# Patient Record
Sex: Female | Born: 1985 | Race: Black or African American | Hispanic: No | Smoking: Current every day smoker
Health system: Southern US, Community
[De-identification: ages and names within clinical notes are randomized; demographics above are authoritative.]

## PROBLEM LIST (undated history)

## (undated) ENCOUNTER — Emergency Department (HOSPITAL_COMMUNITY): Admission: EM | Payer: Medicaid Other | Source: Home / Self Care

## (undated) ENCOUNTER — Inpatient Hospital Stay (HOSPITAL_COMMUNITY): Payer: Self-pay

## (undated) DIAGNOSIS — IMO0002 Reserved for concepts with insufficient information to code with codable children: Secondary | ICD-10-CM

## (undated) DIAGNOSIS — K219 Gastro-esophageal reflux disease without esophagitis: Secondary | ICD-10-CM

## (undated) DIAGNOSIS — K2970 Gastritis, unspecified, without bleeding: Secondary | ICD-10-CM

## (undated) DIAGNOSIS — R011 Cardiac murmur, unspecified: Secondary | ICD-10-CM

## (undated) DIAGNOSIS — O039 Complete or unspecified spontaneous abortion without complication: Secondary | ICD-10-CM

## (undated) DIAGNOSIS — A630 Anogenital (venereal) warts: Secondary | ICD-10-CM

## (undated) DIAGNOSIS — D573 Sickle-cell trait: Secondary | ICD-10-CM

## (undated) HISTORY — PX: CRYOTHERAPY: SHX1416

## (undated) HISTORY — PX: COLONOSCOPY: SHX5424

## (undated) HISTORY — PX: INDUCED ABORTION: SHX677

## (undated) HISTORY — PX: COLPOSCOPY: SHX161

## (undated) HISTORY — PX: UPPER GASTROINTESTINAL ENDOSCOPY: SHX188

---

## 2007-01-21 ENCOUNTER — Emergency Department (HOSPITAL_COMMUNITY): Admission: EM | Admit: 2007-01-21 | Discharge: 2007-01-21 | Payer: Self-pay | Admitting: Emergency Medicine

## 2007-09-24 ENCOUNTER — Emergency Department (HOSPITAL_COMMUNITY): Admission: EM | Admit: 2007-09-24 | Discharge: 2007-09-24 | Payer: Self-pay | Admitting: Family Medicine

## 2007-09-25 ENCOUNTER — Inpatient Hospital Stay (HOSPITAL_COMMUNITY): Admission: AD | Admit: 2007-09-25 | Discharge: 2007-09-25 | Payer: Self-pay | Admitting: Obstetrics & Gynecology

## 2007-11-03 ENCOUNTER — Inpatient Hospital Stay (HOSPITAL_COMMUNITY): Admission: AD | Admit: 2007-11-03 | Discharge: 2007-11-04 | Payer: Self-pay | Admitting: Family Medicine

## 2007-11-15 ENCOUNTER — Inpatient Hospital Stay (HOSPITAL_COMMUNITY): Admission: AD | Admit: 2007-11-15 | Discharge: 2007-11-15 | Payer: Self-pay | Admitting: Obstetrics & Gynecology

## 2007-11-17 ENCOUNTER — Inpatient Hospital Stay (HOSPITAL_COMMUNITY): Admission: AD | Admit: 2007-11-17 | Discharge: 2007-11-17 | Payer: Self-pay | Admitting: Obstetrics and Gynecology

## 2008-01-12 ENCOUNTER — Inpatient Hospital Stay (HOSPITAL_COMMUNITY): Admission: AD | Admit: 2008-01-12 | Discharge: 2008-01-12 | Payer: Self-pay | Admitting: Obstetrics & Gynecology

## 2008-07-16 ENCOUNTER — Emergency Department (HOSPITAL_COMMUNITY): Admission: EM | Admit: 2008-07-16 | Discharge: 2008-07-16 | Payer: Self-pay | Admitting: Emergency Medicine

## 2008-08-01 ENCOUNTER — Emergency Department (HOSPITAL_COMMUNITY): Admission: EM | Admit: 2008-08-01 | Discharge: 2008-08-01 | Payer: Self-pay | Admitting: Emergency Medicine

## 2008-10-01 ENCOUNTER — Emergency Department (HOSPITAL_COMMUNITY): Admission: EM | Admit: 2008-10-01 | Discharge: 2008-10-01 | Payer: Self-pay | Admitting: Emergency Medicine

## 2009-06-17 ENCOUNTER — Inpatient Hospital Stay (HOSPITAL_COMMUNITY): Admission: AD | Admit: 2009-06-17 | Discharge: 2009-06-17 | Payer: Self-pay | Admitting: Obstetrics

## 2010-09-13 ENCOUNTER — Inpatient Hospital Stay (HOSPITAL_COMMUNITY)
Admission: AD | Admit: 2010-09-13 | Discharge: 2010-09-13 | Disposition: A | Discharge status: Home / Self Care | Payer: Medicaid Other | Source: Ambulatory Visit | Attending: Obstetrics & Gynecology | Admitting: Obstetrics & Gynecology

## 2010-09-13 DIAGNOSIS — N898 Other specified noninflammatory disorders of vagina: Secondary | ICD-10-CM | POA: Insufficient documentation

## 2010-09-14 ENCOUNTER — Inpatient Hospital Stay (HOSPITAL_COMMUNITY)
Admission: AD | Admit: 2010-09-14 | Discharge: 2010-09-14 | Disposition: A | Discharge status: Home / Self Care | Payer: Medicaid Other | Source: Ambulatory Visit | Attending: Obstetrics & Gynecology | Admitting: Obstetrics & Gynecology

## 2010-09-14 DIAGNOSIS — O2390 Unspecified genitourinary tract infection in pregnancy, unspecified trimester: Secondary | ICD-10-CM | POA: Insufficient documentation

## 2010-09-14 DIAGNOSIS — N760 Acute vaginitis: Secondary | ICD-10-CM

## 2010-09-14 LAB — URINALYSIS, ROUTINE W REFLEX MICROSCOPIC
Bilirubin Urine: NEGATIVE
Hgb urine dipstick: NEGATIVE
Ketones, ur: NEGATIVE mg/dL
Protein, ur: NEGATIVE mg/dL
Urine Glucose, Fasting: NEGATIVE mg/dL
Urobilinogen, UA: 0.2 mg/dL (ref 0.0–1.0)

## 2010-09-14 LAB — WET PREP, GENITAL: Yeast Wet Prep HPF POC: NONE SEEN

## 2010-09-15 LAB — GC/CHLAMYDIA PROBE AMP, GENITAL
Chlamydia, DNA Probe: NEGATIVE
GC Probe Amp, Genital: NEGATIVE

## 2010-10-26 LAB — POCT PREGNANCY, URINE: Preg Test, Ur: NEGATIVE

## 2010-11-03 LAB — RPR: RPR Ser Ql: NONREACTIVE

## 2010-11-03 LAB — URINALYSIS, ROUTINE W REFLEX MICROSCOPIC
Glucose, UA: NEGATIVE mg/dL
Ketones, ur: NEGATIVE mg/dL
Protein, ur: NEGATIVE mg/dL
Urobilinogen, UA: 1 mg/dL (ref 0.0–1.0)

## 2010-11-03 LAB — GC/CHLAMYDIA PROBE AMP, GENITAL: Chlamydia, DNA Probe: NEGATIVE

## 2010-11-03 LAB — WET PREP, GENITAL

## 2010-11-07 LAB — URINALYSIS, ROUTINE W REFLEX MICROSCOPIC
Bilirubin Urine: NEGATIVE
Hgb urine dipstick: NEGATIVE
Protein, ur: NEGATIVE mg/dL
Urobilinogen, UA: 0.2 mg/dL (ref 0.0–1.0)

## 2010-11-07 LAB — PREGNANCY, URINE: Preg Test, Ur: POSITIVE

## 2011-04-17 LAB — POCT URINALYSIS DIP (DEVICE)
Bilirubin Urine: NEGATIVE
Hgb urine dipstick: NEGATIVE
Ketones, ur: NEGATIVE
Nitrite: NEGATIVE
Protein, ur: NEGATIVE
pH: 6.5

## 2011-04-17 LAB — GC/CHLAMYDIA PROBE AMP, GENITAL
Chlamydia, DNA Probe: NEGATIVE
GC Probe Amp, Genital: NEGATIVE

## 2011-04-17 LAB — CBC
HCT: 34 — ABNORMAL LOW
Hemoglobin: 11.7 — ABNORMAL LOW
MCHC: 34.3
MCV: 86.7
Platelets: 200
RBC: 3.92
RDW: 13.2
WBC: 7.7

## 2011-04-17 LAB — POCT PREGNANCY, URINE
Operator id: 27537
Preg Test, Ur: POSITIVE

## 2011-04-17 LAB — URINALYSIS, ROUTINE W REFLEX MICROSCOPIC
Glucose, UA: NEGATIVE
Ketones, ur: NEGATIVE
Protein, ur: 30 — AB

## 2011-04-17 LAB — URINE MICROSCOPIC-ADD ON

## 2011-04-17 LAB — URINE CULTURE: Colony Count: >=100000

## 2011-04-17 LAB — WET PREP, GENITAL
Trich, Wet Prep: NONE SEEN
Yeast Wet Prep HPF POC: NONE SEEN

## 2011-04-18 ENCOUNTER — Emergency Department (HOSPITAL_COMMUNITY)
Admission: EM | Admit: 2011-04-18 | Discharge: 2011-04-18 | Disposition: A | Discharge status: Home / Self Care | Payer: Self-pay | Attending: Emergency Medicine | Admitting: Emergency Medicine

## 2011-04-18 DIAGNOSIS — Z87440 Personal history of urinary (tract) infections: Secondary | ICD-10-CM | POA: Insufficient documentation

## 2011-04-18 DIAGNOSIS — R42 Dizziness and giddiness: Secondary | ICD-10-CM | POA: Insufficient documentation

## 2011-04-18 DIAGNOSIS — N342 Other urethritis: Secondary | ICD-10-CM | POA: Insufficient documentation

## 2011-04-18 DIAGNOSIS — A499 Bacterial infection, unspecified: Secondary | ICD-10-CM | POA: Insufficient documentation

## 2011-04-18 DIAGNOSIS — N760 Acute vaginitis: Secondary | ICD-10-CM | POA: Insufficient documentation

## 2011-04-18 DIAGNOSIS — R110 Nausea: Secondary | ICD-10-CM | POA: Insufficient documentation

## 2011-04-18 DIAGNOSIS — N938 Other specified abnormal uterine and vaginal bleeding: Secondary | ICD-10-CM | POA: Insufficient documentation

## 2011-04-18 DIAGNOSIS — N949 Unspecified condition associated with female genital organs and menstrual cycle: Secondary | ICD-10-CM | POA: Insufficient documentation

## 2011-04-18 DIAGNOSIS — D573 Sickle-cell trait: Secondary | ICD-10-CM | POA: Insufficient documentation

## 2011-04-18 DIAGNOSIS — B9689 Other specified bacterial agents as the cause of diseases classified elsewhere: Secondary | ICD-10-CM | POA: Insufficient documentation

## 2011-04-18 LAB — URINALYSIS, ROUTINE W REFLEX MICROSCOPIC
Glucose, UA: NEGATIVE mg/dL
Leukocytes, UA: NEGATIVE
Protein, ur: NEGATIVE mg/dL
Specific Gravity, Urine: 1.012 (ref 1.005–1.030)
pH: 6 (ref 5.0–8.0)

## 2011-04-18 LAB — CBC
Hemoglobin: 10.6 — ABNORMAL LOW
MCHC: 35.6
MCV: 88.1
RBC: 3.4 — ABNORMAL LOW
RDW: 14.2

## 2011-04-18 LAB — GC/CHLAMYDIA PROBE AMP, GENITAL
Chlamydia, DNA Probe: NEGATIVE
GC Probe Amp, Genital: NEGATIVE

## 2011-04-18 LAB — WET PREP, GENITAL

## 2011-04-18 LAB — POCT I-STAT, CHEM 8
Calcium, Ion: 1.22 mmol/L (ref 1.12–1.32)
Creatinine, Ser: 0.9 mg/dL (ref 0.50–1.10)
Glucose, Bld: 91 mg/dL (ref 70–99)
HCT: 35 % — ABNORMAL LOW (ref 36.0–46.0)
Hemoglobin: 11.9 g/dL — ABNORMAL LOW (ref 12.0–15.0)
TCO2: 25 mmol/L (ref 0–100)

## 2011-04-18 LAB — URINE MICROSCOPIC-ADD ON

## 2011-04-20 LAB — URINALYSIS, ROUTINE W REFLEX MICROSCOPIC
Bilirubin Urine: NEGATIVE
Glucose, UA: NEGATIVE
Protein, ur: NEGATIVE

## 2011-04-20 LAB — POCT PREGNANCY, URINE
Operator id: 223331
Preg Test, Ur: NEGATIVE

## 2011-04-20 LAB — WET PREP, GENITAL: Yeast Wet Prep HPF POC: NONE SEEN

## 2011-04-20 LAB — URINE MICROSCOPIC-ADD ON: WBC, UA: NONE SEEN

## 2011-04-20 LAB — GC/CHLAMYDIA PROBE AMP, GENITAL: GC Probe Amp, Genital: NEGATIVE

## 2011-04-28 LAB — CBC
HCT: 34.2 % — ABNORMAL LOW (ref 36.0–46.0)
Hemoglobin: 11.6 g/dL — ABNORMAL LOW (ref 12.0–15.0)
RBC: 3.95 MIL/uL (ref 3.87–5.11)
WBC: 3.8 10*3/uL — ABNORMAL LOW (ref 4.0–10.5)

## 2011-04-28 LAB — PREGNANCY, URINE: Preg Test, Ur: POSITIVE

## 2011-04-28 LAB — URINALYSIS, ROUTINE W REFLEX MICROSCOPIC
Nitrite: NEGATIVE
Specific Gravity, Urine: 1.011 (ref 1.005–1.030)
pH: 6 (ref 5.0–8.0)

## 2011-04-28 LAB — WET PREP, GENITAL
Trich, Wet Prep: NONE SEEN
Yeast Wet Prep HPF POC: NONE SEEN

## 2011-04-28 LAB — DIFFERENTIAL
Eosinophils Relative: 1 % (ref 0–5)
Lymphocytes Relative: 25 % (ref 12–46)
Lymphs Abs: 0.9 10*3/uL (ref 0.7–4.0)
Monocytes Absolute: 0.2 10*3/uL (ref 0.1–1.0)

## 2011-04-28 LAB — BASIC METABOLIC PANEL
GFR calc non Af Amer: >60 mL/min (ref >60)
Potassium: 3.6 mEq/L (ref 3.5–5.1)
Sodium: 137 mEq/L (ref 135–145)

## 2011-04-28 LAB — HCG, QUANTITATIVE, PREGNANCY: hCG, Beta Chain, Quant, S: 6344 m[IU]/mL — ABNORMAL HIGH (ref <5)

## 2011-06-08 ENCOUNTER — Encounter: Payer: Self-pay | Admitting: *Deleted

## 2011-06-08 ENCOUNTER — Emergency Department (HOSPITAL_COMMUNITY)
Admission: EM | Admit: 2011-06-08 | Discharge: 2011-06-08 | Disposition: A | Discharge status: Home / Self Care | Payer: Medicaid Other | Attending: Emergency Medicine | Admitting: Emergency Medicine

## 2011-06-08 DIAGNOSIS — N898 Other specified noninflammatory disorders of vagina: Secondary | ICD-10-CM | POA: Insufficient documentation

## 2011-06-08 DIAGNOSIS — N9089 Other specified noninflammatory disorders of vulva and perineum: Secondary | ICD-10-CM

## 2011-06-08 DIAGNOSIS — T7840XA Allergy, unspecified, initial encounter: Secondary | ICD-10-CM

## 2011-06-08 DIAGNOSIS — X58XXXA Exposure to other specified factors, initial encounter: Secondary | ICD-10-CM | POA: Insufficient documentation

## 2011-06-08 LAB — URINALYSIS, ROUTINE W REFLEX MICROSCOPIC
Glucose, UA: NEGATIVE mg/dL
Hgb urine dipstick: NEGATIVE
Leukocytes, UA: NEGATIVE
Protein, ur: NEGATIVE mg/dL
Specific Gravity, Urine: 1.011 (ref 1.005–1.030)
pH: 5.5 (ref 5.0–8.0)

## 2011-06-08 LAB — WET PREP, GENITAL
Clue Cells Wet Prep HPF POC: NONE SEEN
Trich, Wet Prep: NONE SEEN
WBC, Wet Prep HPF POC: NONE SEEN
Yeast Wet Prep HPF POC: NONE SEEN

## 2011-06-08 NOTE — ED Provider Notes (Signed)
History     CSN: [id number] Arrival date & time: 06/08/2011 10:40 AM     Chief Complaint  Patient presents with  . Vaginal Pain    HPI Pt was seen at 1130.  Per pt, c/o gradual onset and persistence of constant labial "swelling" that began today after using "colored" condoms.  Pt states this has happened previously with the same colored condoms.  Pt also c/o gradual onset and persistence of constant vaginal discharge for the past several days.  Denies vaginal bleeding, no fevers, no dysuria, no back pain, no abd pain, no rash.    History reviewed. No pertinent past medical history.  History reviewed. No pertinent past surgical history.   History  Substance Use Topics  . Smoking status: Never Smoker   . Smokeless tobacco: Not on file  . Alcohol Use: No    Review of Systems ROS: Statement: All systems negative except as marked or noted in the HPI; Constitutional: Negative for fever and chills. ; ; Eyes: Negative for eye pain, redness and discharge. ; ; ENMT: Negative for ear pain, hoarseness, nasal congestion, sinus pressure and sore throat. ; ; Cardiovascular: Negative for chest pain, palpitations, diaphoresis, dyspnea and peripheral edema. ; ; Respiratory: Negative for cough, wheezing and stridor. ; ; Gastrointestinal: Negative for nausea, vomiting, diarrhea and abdominal pain, blood in stool, hematemesis, jaundice and rectal bleeding. . ; ; Genitourinary: Negative for dysuria, flank pain and hematuria. GYN:  No vaginal bleeding, +vaginal discharge, +vulvar edema.; Musculoskeletal: Negative for back pain and neck pain. Negative for swelling and trauma.; ; Skin: Negative for pruritus, rash, abrasions, blisters, bruising and skin lesion.; ; Neuro: Negative for headache, lightheadedness and neck stiffness. Negative for weakness, altered level of consciousness , altered mental status, extremity weakness, paresthesias, involuntary movement, seizure and syncope.     Allergies   Penicillins  Home Medications   Current Outpatient Rx  Name Route Sig Dispense Refill  . DOXYCYCLINE HYCLATE 100 MG PO CAPS Oral Take 100 mg by mouth 2 (two) times daily.      Marland Kitchen FERROUS SULFATE 325 (65 FE) MG PO TABS Oral Take 325 mg by mouth every other day.        BP 120/67  Pulse 86  Temp(Src) 99 F (37.2 C) (Oral)  Resp 16  SpO2 99%  LMP 05/15/2011  Physical Exam 1205: Physical examination:  Nursing notes reviewed; Vital signs and O2 SAT reviewed;  Constitutional: Well developed, Well nourished, Well hydrated, In no acute distress; Head:  Normocephalic, atraumatic; Eyes: EOMI, PERRL, No scleral icterus; ENMT: Mouth and pharynx normal, Mucous membranes moist; Neck: Supple, Full range of motion, No lymphadenopathy; Cardiovascular: Regular rate and rhythm, No murmur, rub, or gallop; Respiratory: Breath sounds clear & equal bilaterally, No rales, rhonchi, wheezes, or rub, Normal respiratory effort/excursion; Chest: Nontender, Movement normal; Abdomen: Soft, Nontender, Nondistended, Normal bowel sounds; Genitourinary: No CVA tenderness, Pelvic exam performed with permission of pt and female ED tech assist during exam.  External genitalia edematous but w/o erythema, ecchymosis or lesions. Vaginal vault with thin yellow-white discharge.  Cervix w/o lesions, not friable, GC/chlam and wet prep obtained and sent to lab.  Bimanual exam w/o CMT, uterine or adenexal tenderness.; Extremities: Pulses normal, No tenderness, No edema, No calf edema or asymmetry.; Neuro: AA&Ox3, Major CN grossly intact.  No gross focal motor or sensory deficits in extremities.; Skin: Color normal, Warm, Dry, no rash.    ED Course  Procedures   MDM  MDM Reviewed: nursing note and  vitals Interpretation: labs    Results for orders placed during the hospital encounter of 06/08/11  URINALYSIS, ROUTINE W REFLEX MICROSCOPIC      Component Value Range   Color, Urine YELLOW  YELLOW    Appearance CLEAR  CLEAR     Specific Gravity, Urine 1.011  1.005 - 1.030    pH 5.5  5.0 - 8.0    Glucose, UA NEGATIVE  NEGATIVE (mg/dL)   Hgb urine dipstick NEGATIVE  NEGATIVE    Bilirubin Urine NEGATIVE  NEGATIVE    Ketones, ur NEGATIVE  NEGATIVE (mg/dL)   Protein, ur NEGATIVE  NEGATIVE (mg/dL)   Urobilinogen, UA 0.2  0.0 - 1.0 (mg/dL)   Nitrite NEGATIVE  NEGATIVE    Leukocytes, UA NEGATIVE  NEGATIVE   WET PREP, GENITAL      Component Value Range   Yeast, Wet Prep NONE SEEN  NONE SEEN    Trich, Wet Prep NONE SEEN  NONE SEEN    Clue Cells, Wet Prep NONE SEEN  NONE SEEN    WBC, Wet Prep HPF POC NONE SEEN  NONE SEEN   PREGNANCY, URINE      Component Value Range   Preg Test, Ur NEGATIVE      12:57 PM:  States she wants to leave right now.  Likely localized allergic rxn to colored condoms, as this has happened previously under the same circumstances; pt encouraged not to use them again.  Dx testing d/w pt.  Questions answered.  Verb understanding, agreeable to d/c home with outpt f/u.    Hiliary Osorto Allison Quarry, DO 06/09/11 2204

## 2011-06-08 NOTE — ED Notes (Addendum)
Pt had intercourse this AM, noticed vaginal swelling immediately after the intercourse. Been having vaginal discharge x 3week, large amount, yellow color with odor. sts her perineal feels tight and itchy  Pt sts she only has one partner. Upon brief assessment, pt's perineal appeared red and swollen. Partner uses latex condom, happened once before, and both occurrence are with colored latex condom.

## 2011-06-08 NOTE — ED Notes (Signed)
Pt in c/o vaginal pain and swelling after intercourse today, pt states the swelling is abnormal, also pt noted increased discharge and odor over last few days

## 2011-06-09 LAB — GC/CHLAMYDIA PROBE AMP, GENITAL: GC Probe Amp, Genital: NEGATIVE

## 2011-07-03 ENCOUNTER — Emergency Department (HOSPITAL_COMMUNITY)
Admission: EM | Admit: 2011-07-03 | Discharge: 2011-07-03 | Disposition: A | Discharge status: Home / Self Care | Payer: Medicaid Other | Attending: Emergency Medicine | Admitting: Emergency Medicine

## 2011-07-03 ENCOUNTER — Encounter (HOSPITAL_COMMUNITY): Payer: Self-pay | Admitting: *Deleted

## 2011-07-03 DIAGNOSIS — R10816 Epigastric abdominal tenderness: Secondary | ICD-10-CM | POA: Insufficient documentation

## 2011-07-03 DIAGNOSIS — A5903 Trichomonal cystitis and urethritis: Secondary | ICD-10-CM | POA: Insufficient documentation

## 2011-07-03 DIAGNOSIS — N390 Urinary tract infection, site not specified: Secondary | ICD-10-CM

## 2011-07-03 LAB — URINALYSIS, ROUTINE W REFLEX MICROSCOPIC
Glucose, UA: NEGATIVE mg/dL
Hgb urine dipstick: NEGATIVE
Ketones, ur: NEGATIVE mg/dL
Protein, ur: NEGATIVE mg/dL
pH: 6.5 (ref 5.0–8.0)

## 2011-07-03 LAB — URINE MICROSCOPIC-ADD ON

## 2011-07-03 MED ORDER — PHENAZOPYRIDINE HCL 200 MG PO TABS: 200.0000 mg | ORAL_TABLET | Freq: Three times a day (TID) | ORAL | Status: AC

## 2011-07-03 MED ORDER — NITROFURANTOIN MONOHYD MACRO 100 MG PO CAPS: 100.0000 mg | ORAL_CAPSULE | Freq: Two times a day (BID) | ORAL | Status: AC

## 2011-07-03 MED ORDER — METRONIDAZOLE 500 MG PO TABS
2000.0000 mg | ORAL_TABLET | Freq: Once | ORAL | Status: AC
  Administered 2011-07-03: 2000 mg via ORAL

## 2011-07-03 NOTE — ED Provider Notes (Signed)
Medical screening examination/treatment/procedure(s) were performed by non-physician practitioner and as supervising physician I was immediately available for consultation/collaboration.  Flint Melter, MD 07/03/11 [phone]

## 2011-07-03 NOTE — ED Provider Notes (Signed)
History     CSN: [id number] Arrival date & time: 07/03/2011  7:04 PM   First MD Initiated Contact with Patient 07/03/11 2054      Chief Complaint  Patient presents with  . Urinary Tract Infection    pt c/o urinary urgency, and reports urine has an odor.     (Consider location/radiation/quality/duration/timing/severity/associated sxs/prior treatment) HPI History provided by pt.   Pt has had tingling w/ urination and cloudy-colored urine for the past 2+ wks.  No urinary frequency or hematuria.  No associated fever or N/V.  Has pain across low back as well as abd pain.  No vaginal sx.  H/o UTI and presented similarly.  No relief w/ azo standard.  History reviewed. No pertinent past medical history.  History reviewed. No pertinent past surgical history.  History reviewed. No pertinent family history.  History  Substance Use Topics  . Smoking status: Never Smoker   . Smokeless tobacco: Not on file  . Alcohol Use: Yes     occ    OB History    Grav Para Term Preterm Abortions TAB SAB Ect Mult Living                  Review of Systems  All other systems reviewed and are negative.    Allergies  Penicillins  Home Medications   Current Outpatient Rx  Name Route Sig Dispense Refill  . FERROUS SULFATE 325 (65 FE) MG PO TABS Oral Take 325 mg by mouth every other day.      Marland Kitchen PRENATAL 27-0.8 MG PO TABS Oral Take 1 tablet by mouth daily.        BP 109/74  Pulse 79  Temp(Src) 98.9 F (37.2 C) (Oral)  Resp 20  Wt 108 lb (48.988 kg)  SpO2 100%  LMP 05/16/2011  Physical Exam  Nursing note and vitals reviewed. Constitutional: She is oriented to person, place, and time. She appears well-developed and well-nourished. No distress.  HENT:  Head: Normocephalic and atraumatic.  Eyes:       Normal appearance  Neck: Normal range of motion.  Cardiovascular: Normal rate and regular rhythm.   Pulmonary/Chest: Effort normal and breath sounds normal.  Abdominal: Soft. Bowel  sounds are normal. She exhibits no distension and no mass. There is no rebound and no guarding.       Mild tenderness epigastrium only.  No CVA tenderness  Neurological: She is alert and oriented to person, place, and time.  Skin: Skin is warm and dry. No rash noted.  Psychiatric: She has a normal mood and affect. Her behavior is normal.    ED Course  Procedures (including critical care time)  Labs Reviewed  URINALYSIS, ROUTINE W REFLEX MICROSCOPIC - Abnormal; Notable for the following:    Leukocytes, UA MODERATE (*)    All other components within normal limits  URINE MICROSCOPIC-ADD ON   No results found.   1. UTI (lower urinary tract infection)   2. Trichomonal urethritis       MDM  Pt presents w/ c/o tingling w/ urination.  U/A pos for WBCs, mod leuk est and trich.  Pyelo unlikely because afebrile and no CVA tenderness or N/V.  Will treat w/ macrobid, single dose of flagyl in ED and pyridium.  Return precautions discussed.         Arie Sabina Blackwell, [place] 07/03/11 [phone]

## 2011-10-03 ENCOUNTER — Emergency Department (HOSPITAL_COMMUNITY)
Admission: EM | Admit: 2011-10-03 | Discharge: 2011-10-04 | Disposition: A | Discharge status: Home / Self Care | Payer: Medicaid Other | Attending: Emergency Medicine | Admitting: Emergency Medicine

## 2011-10-03 ENCOUNTER — Encounter (HOSPITAL_COMMUNITY): Payer: Self-pay | Admitting: *Deleted

## 2011-10-03 DIAGNOSIS — K625 Hemorrhage of anus and rectum: Secondary | ICD-10-CM | POA: Insufficient documentation

## 2011-10-03 DIAGNOSIS — R109 Unspecified abdominal pain: Secondary | ICD-10-CM | POA: Insufficient documentation

## 2011-10-03 DIAGNOSIS — K649 Unspecified hemorrhoids: Secondary | ICD-10-CM

## 2011-10-03 DIAGNOSIS — K644 Residual hemorrhoidal skin tags: Secondary | ICD-10-CM | POA: Insufficient documentation

## 2011-10-03 LAB — DIFFERENTIAL
Basophils Absolute: 0 10*3/uL (ref 0.0–0.1)
Basophils Relative: 1 % (ref 0–1)
Lymphocytes Relative: 50 % — ABNORMAL HIGH (ref 12–46)
Monocytes Relative: 5 % (ref 3–12)
Neutro Abs: 1.6 10*3/uL — ABNORMAL LOW (ref 1.7–7.7)
Neutrophils Relative %: 40 % — ABNORMAL LOW (ref 43–77)

## 2011-10-03 LAB — POCT I-STAT, CHEM 8
Chloride: 103 mEq/L (ref 96–112)
HCT: 37 % (ref 36.0–46.0)
Hemoglobin: 12.6 g/dL (ref 12.0–15.0)
Potassium: 3.9 mEq/L (ref 3.5–5.1)
Sodium: 139 mEq/L (ref 135–145)

## 2011-10-03 LAB — CBC
HCT: 34.3 % — ABNORMAL LOW (ref 36.0–46.0)
Hemoglobin: 12.4 g/dL (ref 12.0–15.0)
MCHC: 36.2 g/dL — ABNORMAL HIGH (ref 30.0–36.0)
RDW: 12.8 % (ref 11.5–15.5)
WBC: 4 10*3/uL (ref 4.0–10.5)

## 2011-10-03 NOTE — ED Notes (Signed)
Received pt. From triage, pt. Ambulatory gait steady, NAD noted

## 2011-10-03 NOTE — ED Provider Notes (Addendum)
History     CSN: [id number]  Arrival date & time 10/03/11  2016   First MD Initiated Contact with Patient 10/03/11 2303      Chief Complaint  Patient presents with  . Rectal Bleeding    bright red x 1 today.    (Consider location/radiation/quality/duration/timing/severity/associated sxs/prior treatment) Patient is a 26 y.o. female presenting with hematochezia. The history is provided by the patient. No language interpreter was used.  Rectal Bleeding  The current episode started today. The onset was sudden. The problem occurs rarely. The problem has been resolved. The patient is experiencing no pain. The stool is described as hard. There was no prior unsuccessful therapy. Pertinent negatives include no anorexia, no fever, no abdominal pain, no diarrhea, no hematemesis, no hemorrhoids, no nausea, no rectal pain, no vomiting, no hematuria, no vaginal bleeding, no vaginal discharge, no chest pain, no headaches, no coughing, no difficulty breathing and no rash. She has been behaving normally. She has been eating and drinking normally. Urine output has been normal. The last void occurred less than 6 hours ago. Her past medical history does not include inflammatory bowel disease, recent abdominal injury, recent antibiotic use, recent change in diet or a recent illness. There were no sick contacts. She has received no recent medical care.    History reviewed. No pertinent past medical history.  History reviewed. No pertinent past surgical history.  History reviewed. No pertinent family history.  History  Substance Use Topics  . Smoking status: Never Smoker   . Smokeless tobacco: Not on file  . Alcohol Use: Yes     occ    OB History    Grav Para Term Preterm Abortions TAB SAB Ect Mult Living                  Review of Systems  Constitutional: Negative for fever.  HENT: Negative.   Eyes: Negative.   Respiratory: Negative for cough.   Cardiovascular: Negative for chest pain.    Gastrointestinal: Positive for hematochezia. Negative for nausea, vomiting, abdominal pain, diarrhea, rectal pain, anorexia, hematemesis and hemorrhoids.  Genitourinary: Negative for hematuria, vaginal bleeding and vaginal discharge.  Musculoskeletal: Negative.   Skin: Negative for rash.  Neurological: Negative for headaches.  Hematological: Negative.   Psychiatric/Behavioral: Negative.     Allergies  Penicillins  Home Medications   Current Outpatient Rx  Name Route Sig Dispense Refill  . FERROUS SULFATE 325 (65 FE) MG PO TABS Oral Take 325 mg by mouth every other day.      Marland Kitchen PRENATAL 27-0.8 MG PO TABS Oral Take 1 tablet by mouth daily.        BP 111/63  Pulse 104  Temp(Src) 98 F (36.7 C) (Oral)  Resp 20  SpO2 97%  Physical Exam  Constitutional: She is oriented to person, place, and time. She appears well-developed.  HENT:  Head: Normocephalic and atraumatic.  Eyes: Conjunctivae are normal. Pupils are equal, round, and reactive to light.  Neck: Normal range of motion. Neck supple. No tracheal deviation present.  Cardiovascular: Normal rate and regular rhythm.   Pulmonary/Chest: Effort normal and breath sounds normal.  Abdominal: Soft. Bowel sounds are normal. There is no tenderness. There is no rebound and no guarding.  Genitourinary: Guaiac positive stool.       Hemorrhoid external non thrombosed  Musculoskeletal: Normal range of motion. She exhibits no edema.  Neurological: She is alert and oriented to person, place, and time.  Skin: Skin is warm and dry.  Psychiatric: She has a normal mood and affect.    ED Course  Procedures (including critical care time)   Labs Reviewed  CBC  DIFFERENTIAL   No results found.   No diagnosis found.    MDM  Follow up with your family doctor and GI return immediately for worsening bleeding patient verbalizes understanding and agrees to follow up      Andreia Gandolfi K Fayez Sturgell-Rasch, MD 10/04/11 0025  Remigio Mcmillon Smitty Cords, MD 10/04/11 0345

## 2011-10-03 NOTE — ED Notes (Signed)
Pt in c/o episode of rectal bleeding today, states blood was bright red, denies pain

## 2011-10-04 ENCOUNTER — Emergency Department (HOSPITAL_COMMUNITY): Payer: Medicaid Other

## 2011-10-04 LAB — POCT PREGNANCY, URINE: Preg Test, Ur: NEGATIVE

## 2011-10-04 MED ORDER — IOHEXOL 300 MG/ML  SOLN
100.0000 mL | Freq: Once | INTRAMUSCULAR | Status: AC | PRN
  Administered 2011-10-04: 100 mL via INTRAVENOUS

## 2011-10-04 NOTE — Discharge Instructions (Signed)

## 2011-10-04 NOTE — ED Notes (Signed)
Pt. Discharged to home, pt. Ambulatory gait steady,

## 2011-11-03 ENCOUNTER — Encounter (HOSPITAL_COMMUNITY): Payer: Self-pay | Admitting: *Deleted

## 2011-11-03 ENCOUNTER — Inpatient Hospital Stay (HOSPITAL_COMMUNITY)
Admission: AD | Admit: 2011-11-03 | Discharge: 2011-11-03 | Disposition: A | Discharge status: Home / Self Care | Payer: Medicaid Other | Source: Ambulatory Visit | Attending: Obstetrics | Admitting: Obstetrics

## 2011-11-03 DIAGNOSIS — N949 Unspecified condition associated with female genital organs and menstrual cycle: Secondary | ICD-10-CM | POA: Insufficient documentation

## 2011-11-03 DIAGNOSIS — N926 Irregular menstruation, unspecified: Secondary | ICD-10-CM

## 2011-11-03 DIAGNOSIS — N938 Other specified abnormal uterine and vaginal bleeding: Secondary | ICD-10-CM | POA: Insufficient documentation

## 2011-11-03 HISTORY — DX: Reserved for concepts with insufficient information to code with codable children: IMO0002

## 2011-11-03 HISTORY — DX: Anogenital (venereal) warts: A63.0

## 2011-11-03 HISTORY — DX: Sickle-cell trait: D57.3

## 2011-11-03 HISTORY — DX: Gastro-esophageal reflux disease without esophagitis: K21.9

## 2011-11-03 HISTORY — DX: Cardiac murmur, unspecified: R01.1

## 2011-11-03 HISTORY — DX: Gastritis, unspecified, without bleeding: K29.70

## 2011-11-03 LAB — URINE MICROSCOPIC-ADD ON

## 2011-11-03 LAB — URINALYSIS, ROUTINE W REFLEX MICROSCOPIC
Ketones, ur: NEGATIVE mg/dL
Leukocytes, UA: NEGATIVE
Nitrite: NEGATIVE
Protein, ur: NEGATIVE mg/dL
Urobilinogen, UA: 0.2 mg/dL (ref 0.0–1.0)

## 2011-11-03 NOTE — MAU Provider Note (Signed)
History   Pt presents today c/o irregular vag bleeding. She states her period normally begins in about a week from now. She began having spotting on Monday with no pain. Yesterday, she underwent an endoscopy and colonoscopy. Since that time she has had some continued abd cramping. She just wants to make sure everything is ok. She denies fever, dysuria, or any other sx at this time.  CSN: [id number]  Arrival date and time: 11/03/11 2202   None     Chief Complaint  Patient presents with  . Vaginal Bleeding   HPI  OB History    Grav Para Term Preterm Abortions TAB SAB Ect Mult Living   6 4 4  2 2    4       Past Medical History  Diagnosis Date  . Abnormal Pap smear   . Sickle cell trait   . Heart murmur   . HPV (human papilloma virus) anogenital infection   . GERD (gastroesophageal reflux disease)   . Gastritis   . Hemorrhoids     Past Surgical History  Procedure Date  . Colposcopy   . Induced abortion   . Cryotherapy   . Colonoscopy   . Upper gastrointestinal endoscopy     Family History  Problem Relation Age of Onset  . Diabetes Father   . Diabetes Paternal Grandmother     History  Substance Use Topics  . Smoking status: Never Smoker   . Smokeless tobacco: Not on file  . Alcohol Use: Yes     occ    Allergies:  Allergies  Allergen Reactions  . Penicillins Hives    No prescriptions prior to admission    Review of Systems  Constitutional: Negative for fever and chills.  Eyes: Negative for blurred vision and double vision.  Cardiovascular: Negative for chest pain and palpitations.  Gastrointestinal: Positive for abdominal pain. Negative for nausea, vomiting, diarrhea and constipation.  Genitourinary: Negative for dysuria, urgency, frequency and hematuria.  Neurological: Negative for dizziness and headaches.  Psychiatric/Behavioral: Negative for depression and suicidal ideas.   Physical Exam   Blood pressure 116/65, pulse 67, temperature 98.5 F  (36.9 C), resp. rate 18, height 5' 3.5" (1.613 m), weight 110 lb 4 oz (50.009 kg), last menstrual period 10/09/2011.  Physical Exam  Nursing note and vitals reviewed. Constitutional: She is oriented to person, place, and time. She appears well-developed and well-nourished. No distress.  HENT:  Head: Normocephalic and atraumatic.  Eyes: EOM are normal. Pupils are equal, round, and reactive to light.  GI: Soft. She exhibits no distension and no mass. There is no tenderness. There is no rebound and no guarding.  Genitourinary: No bleeding around the vagina. Vaginal discharge found.       Slight vag dc present. Cervix Lg/closed. No active bleeding seen. Uterus anteverted and NL size and shape. No adnexal masses.  Neurological: She is alert and oriented to person, place, and time.  Skin: Skin is warm and dry. She is not diaphoretic.  Psychiatric: She has a normal mood and affect. Her behavior is normal. Judgment and thought content normal.    MAU Course  Procedures  Wet prep and GC/Chlamydia cultures done.  Results for orders placed during the hospital encounter of 11/03/11 (from the past 24 hour(s))  URINALYSIS, ROUTINE W REFLEX MICROSCOPIC     Status: Abnormal   Collection Time   11/03/11 10:14 PM      Component Value Range   Color, Urine YELLOW  YELLOW  APPearance CLEAR  CLEAR    Specific Gravity, Urine 1.020  1.005 - 1.030    pH 6.0  5.0 - 8.0    Glucose, UA NEGATIVE  NEGATIVE (mg/dL)   Hgb urine dipstick LARGE (*) NEGATIVE    Bilirubin Urine NEGATIVE  NEGATIVE    Ketones, ur NEGATIVE  NEGATIVE (mg/dL)   Protein, ur NEGATIVE  NEGATIVE (mg/dL)   Urobilinogen, UA 0.2  0.0 - 1.0 (mg/dL)   Nitrite NEGATIVE  NEGATIVE    Leukocytes, UA NEGATIVE  NEGATIVE   URINE MICROSCOPIC-ADD ON     Status: Normal   Collection Time   11/03/11 10:14 PM      Component Value Range   Squamous Epithelial / LPF RARE  RARE    RBC / HPF 21-50  <3 (RBC/hpf)   Urine-Other MUCOUS PRESENT    POCT  PREGNANCY, URINE     Status: Normal   Collection Time   11/03/11 10:25 PM      Component Value Range   Preg Test, Ur NEGATIVE  NEGATIVE   WET PREP, GENITAL     Status: Abnormal   Collection Time   11/03/11 10:46 PM      Component Value Range   Yeast Wet Prep HPF POC NONE SEEN  NONE SEEN    Trich, Wet Prep NONE SEEN  NONE SEEN    Clue Cells Wet Prep HPF POC NONE SEEN  NONE SEEN    WBC, Wet Prep HPF POC FEW (*) NONE SEEN      Assessment and Plan  Irregular vag bleeding: discussed with pt at length. She is likely starting her menses early. She will f/u with Dr. Gaynell Face. No treatment needed at this time.  Clinton Gallant. Eryn Krejci III, DrHSc, MPAS, PA-C  11/03/2011, 10:53 PM

## 2011-11-03 NOTE — MAU Note (Signed)
Pt had an endo and a colonscopy yesterday; had an abnormal pap smear 2 weeks ago; pt had orange light bleeding earlier today and passed some ? Tissue; no bleeding at present

## 2011-11-03 NOTE — Discharge Instructions (Signed)
Abnormal Uterine Bleeding Abnormal uterine bleeding can have many causes. Some cases are simply treated, while others are more serious. There are several kinds of bleeding that is considered abnormal, including:  Bleeding between periods.   Bleeding after sexual intercourse.   Spotting anytime in the menstrual cycle.   Bleeding heavier or more than normal.   Bleeding after menopause.  CAUSES  There are many causes of abnormal uterine bleeding. It can be present in teenagers, pregnant women, women during their reproductive years, and women who have reached menopause. Your caregiver will look for the more common causes depending on your age, signs, symptoms and your particular circumstance. Most cases are not serious and can be treated. Even the more serious causes, like cancer of the female organs, can be treated adequately if found in the early stages. That is why all types of bleeding should be evaluated and treated as soon as possible. DIAGNOSIS  Diagnosing the cause may take several kinds of tests. Your caregiver may:  Take a complete history of the type of bleeding.   Perform a complete physical exam and Pap smear.   Take an ultrasound on the abdomen showing a picture of the female organs and the pelvis.   Inject dye into the uterus and Fallopian tubes and X-ray them (hysterosalpingogram).   Place fluid in the uterus and do an ultrasound (sonohysterogrqphy).   Take a CT scan to examine the female organs and pelvis.   Take an MRI to examine the female organs and pelvis. There is no X-ray involved with this procedure.   Look inside the uterus with a telescope that has a light at the end (hysteroscopy).   Scrap the inside of the uterus to get tissue to examine (Dilatation and Curettage, D&C).   Look into the pelvis with a telescope that has a light at the end (laparoscopy). This is done through a very small cut (incision) in the abdomen.  TREATMENT  Treatment will depend on the  cause of the abnormal bleeding. It can include:  Doing nothing to allow the problem to take care of itself over time.   Hormone treatment.   Birth control pills.   Treating the medical condition causing the problem.   Laparoscopy.   Major or minor surgery   Destroying the lining of the uterus with electrical currant, laser, freezing or heat (uterine ablation).  HOME CARE INSTRUCTIONS   Follow your caregiver's recommendation on how to treat your problem.   See your caregiver if you missed a menstrual period and think you may be pregnant.   If you are bleeding heavily, count the number of pads/tampons you use and how often you have to change them. Tell this to your caregiver.   Avoid sexual intercourse until the problem is controlled.  SEEK MEDICAL CARE IF:   You have any kind of abnormal bleeding mentioned above.   You feel dizzy at times.   You are 26 years old and have not had a menstrual period yet.  SEEK IMMEDIATE MEDICAL CARE IF:   You pass out.   You are changing pads/tampons every 15 to 30 minutes.   You have belly (abdominal) pain.   You have a temperature of 100 F (37.8 C) or higher.   You become sweaty or weak.   You are passing large blood clots from the vagina.   You start to feel sick to your stomach (nauseous) and throw up (vomit).  Document Released: 07/10/2005 Document Revised: 06/29/2011 Document Reviewed: 12/03/2008 [employer]   Patient Information 2012 [employer]. 

## 2011-11-03 NOTE — Progress Notes (Signed)
Henrietta Hoover PA in to discuss d/c plan with pt. Written and verbal d/c instructions given and understanding voiced.

## 2011-11-04 LAB — GC/CHLAMYDIA PROBE AMP, GENITAL: Chlamydia, DNA Probe: NEGATIVE

## 2012-02-11 ENCOUNTER — Emergency Department (HOSPITAL_COMMUNITY)
Admission: EM | Admit: 2012-02-11 | Discharge: 2012-02-12 | Discharge status: Against Medical Advice | Payer: Medicaid Other | Attending: Emergency Medicine | Admitting: Emergency Medicine

## 2012-02-11 DIAGNOSIS — M545 Low back pain, unspecified: Secondary | ICD-10-CM | POA: Insufficient documentation

## 2012-09-06 ENCOUNTER — Encounter (HOSPITAL_COMMUNITY): Payer: Self-pay | Admitting: *Deleted

## 2012-09-06 ENCOUNTER — Inpatient Hospital Stay (HOSPITAL_COMMUNITY): Payer: Medicaid Other

## 2012-09-06 ENCOUNTER — Inpatient Hospital Stay (HOSPITAL_COMMUNITY)
Admission: AD | Admit: 2012-09-06 | Discharge: 2012-09-06 | Disposition: A | Discharge status: Home / Self Care | Payer: Medicaid Other | Source: Ambulatory Visit | Attending: Obstetrics | Admitting: Obstetrics

## 2012-09-06 DIAGNOSIS — N760 Acute vaginitis: Secondary | ICD-10-CM | POA: Insufficient documentation

## 2012-09-06 DIAGNOSIS — M549 Dorsalgia, unspecified: Secondary | ICD-10-CM

## 2012-09-06 DIAGNOSIS — O2390 Unspecified genitourinary tract infection in pregnancy, unspecified trimester: Secondary | ICD-10-CM | POA: Insufficient documentation

## 2012-09-06 DIAGNOSIS — A499 Bacterial infection, unspecified: Secondary | ICD-10-CM | POA: Insufficient documentation

## 2012-09-06 DIAGNOSIS — R109 Unspecified abdominal pain: Secondary | ICD-10-CM | POA: Insufficient documentation

## 2012-09-06 DIAGNOSIS — O210 Mild hyperemesis gravidarum: Secondary | ICD-10-CM | POA: Insufficient documentation

## 2012-09-06 DIAGNOSIS — B9689 Other specified bacterial agents as the cause of diseases classified elsewhere: Secondary | ICD-10-CM | POA: Insufficient documentation

## 2012-09-06 LAB — URINALYSIS, ROUTINE W REFLEX MICROSCOPIC
Glucose, UA: NEGATIVE mg/dL
Hgb urine dipstick: NEGATIVE
Ketones, ur: NEGATIVE mg/dL
Leukocytes, UA: NEGATIVE
Protein, ur: NEGATIVE mg/dL
Urobilinogen, UA: 0.2 mg/dL (ref 0.0–1.0)

## 2012-09-06 LAB — CBC
HCT: 28.9 % — ABNORMAL LOW (ref 36.0–46.0)
Hemoglobin: 10.4 g/dL — ABNORMAL LOW (ref 12.0–15.0)
MCH: 28.8 pg (ref 26.0–34.0)
MCHC: 36 g/dL (ref 30.0–36.0)
RDW: 12.6 % (ref 11.5–15.5)

## 2012-09-06 LAB — POCT PREGNANCY, URINE: Preg Test, Ur: POSITIVE — AB

## 2012-09-06 LAB — WET PREP, GENITAL: Yeast Wet Prep HPF POC: NONE SEEN

## 2012-09-06 MED ORDER — METRONIDAZOLE 500 MG PO TABS: 500.0000 mg | ORAL_TABLET | Freq: Two times a day (BID) | ORAL | Status: DC

## 2012-09-06 MED ORDER — ONDANSETRON HCL 8 MG PO TABS: 8.0000 mg | ORAL_TABLET | Freq: Three times a day (TID) | ORAL | Status: DC | PRN

## 2012-09-06 MED ORDER — ONDANSETRON 8 MG PO TBDP
8.0000 mg | ORAL_TABLET | Freq: Once | ORAL | Status: AC
  Administered 2012-09-06: 8 mg via ORAL
  Filled 2012-09-06: qty 1

## 2012-09-06 NOTE — MAU Provider Note (Signed)
History     CSN: [id number]  Arrival date and time: 09/06/12 1919   First Provider Initiated Contact with Patient 09/06/12 2012      No chief complaint on file.  HPI Pt is [id number] [redacted] weeks pregnant with nausea  since last week all day long with constant burping.  She is also complaining of lower abdominal pain feels like heating sensation and is worse when she eats.  The pain is described at nagging.  The pain comes and goes and just started today.  Whe she is still the pain is worse.  Pt has had a colonoscopy last year due to  Bleeding with bowel movement, but was normal.  She also was put on a H2 blocker, which she has not been taking.  She denies spotting or bleeding, constipation or diarrhea.    Past Medical History  Diagnosis Date  . Abnormal Pap smear   . Sickle cell trait   . Heart murmur   . HPV (human papilloma virus) anogenital infection   . GERD (gastroesophageal reflux disease)   . Gastritis   . Hemorrhoids     Past Surgical History  Procedure Laterality Date  . Colposcopy    . Induced abortion    . Cryotherapy    . Colonoscopy    . Upper gastrointestinal endoscopy    . Cesarean section      Family History  Problem Relation Age of Onset  . Diabetes Father   . Diabetes Paternal Grandmother     History  Substance Use Topics  . Smoking status: Never Smoker   . Smokeless tobacco: Not on file  . Alcohol Use: Yes     Comment: occ    Allergies:  Allergies  Allergen Reactions  . Penicillins Hives  . Latex Swelling and Rash    Prescriptions prior to admission  Medication Sig Dispense Refill  . ferrous sulfate dried (SLOW FE) 160 (50 FE) MG TBCR Take 160 mg by mouth daily.        Review of Systems  Constitutional: Negative for fever and chills.  Respiratory: Negative for cough.   Gastrointestinal: Positive for heartburn, nausea and abdominal pain. Negative for vomiting, diarrhea, constipation and blood in stool.  Genitourinary: Negative for dysuria,  urgency and frequency.  Musculoskeletal: Positive for back pain.  Neurological: Negative for dizziness and headaches.   Physical Exam   Blood pressure 121/70, pulse 69, temperature 98.2 F (36.8 C), temperature source Oral, height 5\' 4"  (1.626 m), weight 114 lb 2 oz (51.767 kg), last menstrual period 07/19/2012.  Physical Exam  Constitutional: She is oriented to person, place, and time. She appears well-developed and well-nourished. No distress.  Cardiovascular: Normal rate.   Respiratory: Effort normal.  GI: Soft. She exhibits no distension and no mass. There is no tenderness. There is no rebound and no guarding.  Genitourinary: Uterus normal. Vaginal discharge (milky) found.  Uterus 6-8 wk size  Slightly tender  Musculoskeletal: Normal range of motion.  Neurological: She is alert and oriented to person, place, and time.  Skin: Skin is warm and dry.  Psychiatric: She has a normal mood and affect.    MAU Course  Procedures Zofran ordered for nausea HCG, CBC, [place] for abd pain ordered Wet prep and GC/chlamydia ordered Care handed over to Wynelle Bourgeois, CNM   Assessment and Plan    LINEBERRY,SUSAN 09/06/2012, 8:12 PM   Results for orders placed during the hospital encounter of 09/06/12 (from the past 24 hour(s))  URINALYSIS, ROUTINE W  REFLEX MICROSCOPIC     Status: None   Collection Time    09/06/12  7:40 PM      Result Value Range   Color, Urine YELLOW  YELLOW   APPearance CLEAR  CLEAR   Specific Gravity, Urine 1.010  1.005 - 1.030   pH 6.0  5.0 - 8.0   Glucose, UA NEGATIVE  NEGATIVE mg/dL   Hgb urine dipstick NEGATIVE  NEGATIVE   Bilirubin Urine NEGATIVE  NEGATIVE   Ketones, ur NEGATIVE  NEGATIVE mg/dL   Protein, ur NEGATIVE  NEGATIVE mg/dL   Urobilinogen, UA 0.2  0.0 - 1.0 mg/dL   Nitrite NEGATIVE  NEGATIVE   Leukocytes, UA NEGATIVE  NEGATIVE  POCT PREGNANCY, URINE     Status: Abnormal   Collection Time    09/06/12  7:50 PM      Result Value Range   Preg  Test, Ur POSITIVE (*) NEGATIVE  CBC     Status: Abnormal   Collection Time    09/06/12  8:22 PM      Result Value Range   WBC 5.6  4.0 - 10.5 K/uL   RBC 3.61 (*) 3.87 - 5.11 MIL/uL   Hemoglobin 10.4 (*) 12.0 - 15.0 g/dL   HCT 16.1 (*) 09.6 - 04.5 %   MCV 80.1  78.0 - 100.0 fL   MCH 28.8  26.0 - 34.0 pg   MCHC 36.0  30.0 - 36.0 g/dL   RDW 40.9  81.1 - 91.4 %   Platelets 189  150 - 400 K/uL  HCG, QUANTITATIVE, PREGNANCY     Status: Abnormal   Collection Time    09/06/12  8:22 PM      Result Value Range   hCG, Beta Chain, Quant, S 93570 (*) <5 mIU/mL  WET PREP, GENITAL     Status: Abnormal   Collection Time    09/06/12  9:00 PM      Result Value Range   Yeast Wet Prep HPF POC NONE SEEN  NONE SEEN   Trich, Wet Prep NONE SEEN  NONE SEEN   Clue Cells Wet Prep HPF POC MODERATE (*) NONE SEEN   WBC, Wet Prep HPF POC MODERATE (*) NONE SEEN   US Ob Comp Less 14 Wks  09/06/2012  *RADIOLOGY REPORT*  Clinical Data: 27 year old pregnant female with pelvic pain and nausea.  Estimated gestation of 7 weeks 0 days by LMP.  OBSTETRIC <14 WK ULTRASOUND  Technique:  Transabdominal ultrasound was performed for evaluation of the gestation as well as the maternal uterus and adnexal regions.  Comparison:  None.  Intrauterine gestational sac: Visualized/normal in shape. Yolk sac: Present Embryo: Present Cardiac Activity: Present Heart Rate: 155 bpm  CRL:  11.2 mm  7 w  2 d       [place] EDC: 04/23/2013  Maternal uterus/Adnexae: There is no evidence of subchorionic hemorrhage.  The right ovary is not visualized. The left ovary is unremarkable. There is no evidence of free fluid or adnexal mass.  IMPRESSION: Single living intrauterine gestation with estimated gestational age of [redacted] weeks 2 days by this ultrasound.  No evidence of subchorionic hemorrhage.  Right ovary not visualized.   Original Report Authenticated By: Harmon Pier, M.D.    Proof of pregnancy given Rx Zofran and Flagyl given Follow up with Dr Gaynell Face

## 2012-09-06 NOTE — MAU Note (Addendum)
PT SAYS SHE   TOOK HOME PREG TEST ON 08-04-2012- THEN TOOK ANOTHER   ON 08-08-2012-  BOTH POSTIVE.     SHE BECAME NAUSEATED  LAST Thursday-   NO VOMITED    STARTED CRAMPING IN LOWER ABD TODAY .   NO BLEEDING.  LAST SEX- JAN - 1ST WEEK-  NO BIRTH CONTROL.   PLANS TO GO   TO DR MARSHALL FOR PNC-  NO APPOINTMENT -  WAS SEEN THERE IN 03-2012- FOR STD CHECK

## 2012-09-07 ENCOUNTER — Other Ambulatory Visit: Payer: Self-pay | Admitting: Advanced Practice Midwife

## 2012-09-07 LAB — GC/CHLAMYDIA PROBE AMP: CT Probe RNA: NEGATIVE

## 2012-09-07 MED ORDER — PROMETHAZINE HCL 25 MG PO TABS: 12.5000 mg | ORAL_TABLET | Freq: Four times a day (QID) | ORAL | Status: DC | PRN

## 2012-09-07 NOTE — Progress Notes (Signed)
Pt called to report Zofran prescription too expensive.  Phenergan 25 mg tablets (0.5-1 tab Q 6 hours) called to pt pharmacy and left message with pt.

## 2012-09-20 IMAGING — CT CT ABD-PELV W/ CM
1 of 2 series · 15 of 32 positions shown, 19 images · IV contrast (100 ML OMNI 300)
Comparison: None.

CLINICAL DATA: Abdominal pain, rectal bleeding.

CT ABDOMEN AND PELVIS WITH CONTRAST
TECHNIQUE: Multidetector CT imaging of the abdomen and pelvis was
performed following the standard protocol during bolus
administration of intravenous contrast.
Contrast: 100mL OMNIPAQUE IOHEXOL 300 MG/ML IJ SOLN

[Series 2: abd/pel with · axial · 0.54mm/px · z∈[+1173,+1528]mm · 15 of 77 slices shown, 19 images]
[im 3/77  soft-tissue]
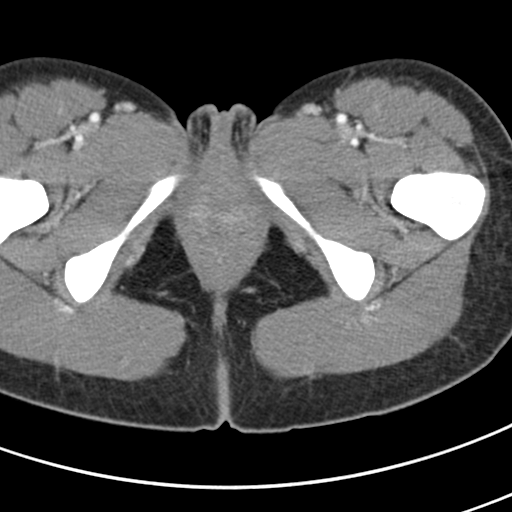
[im 3/77  bone]
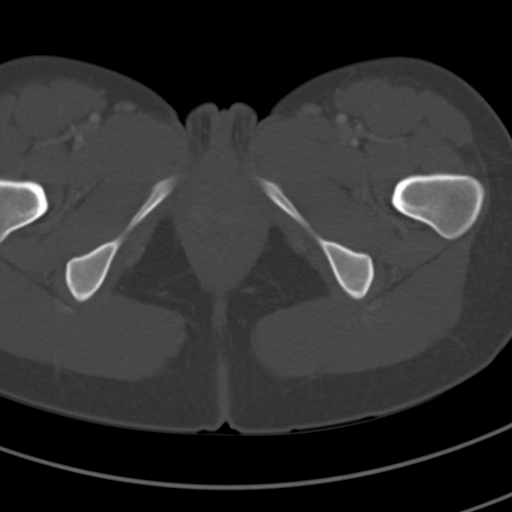
[im 9/77  soft-tissue]
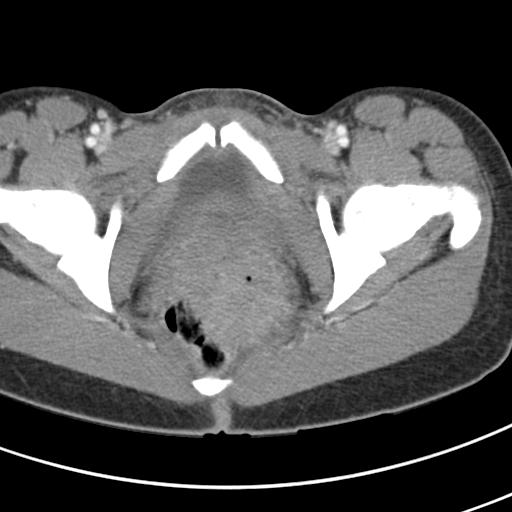
[im 15/77  soft-tissue]
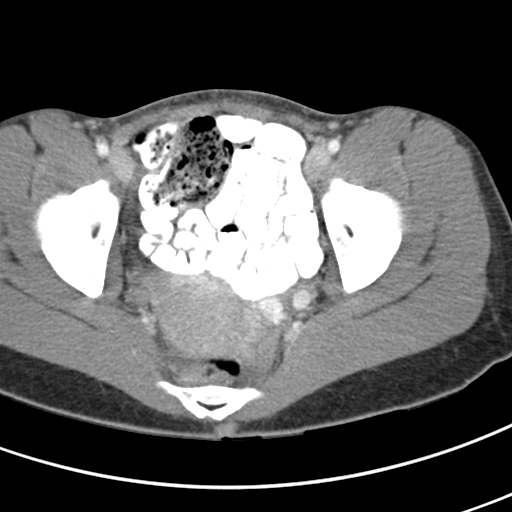
[im 21/77  soft-tissue]
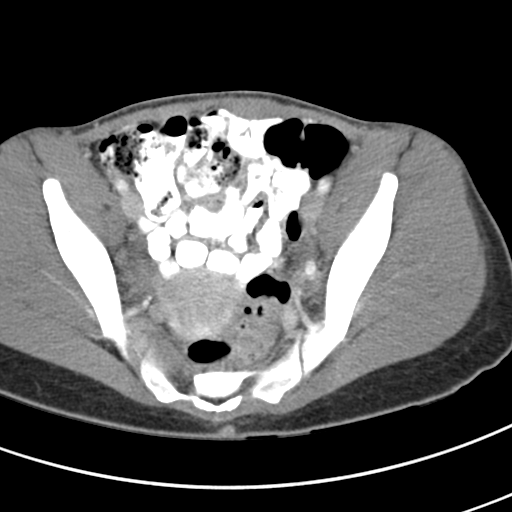
[im 27/77  soft-tissue]
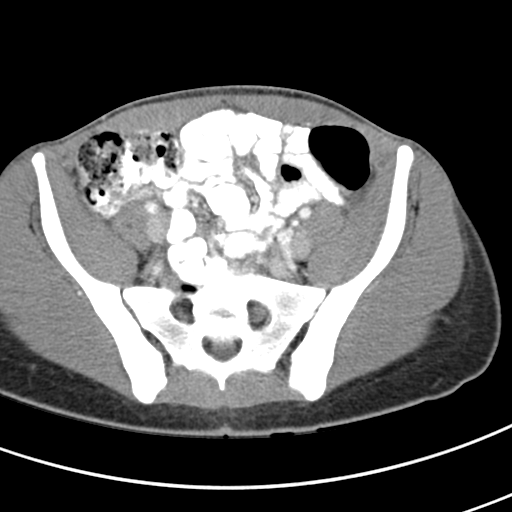
[im 33/77  soft-tissue]
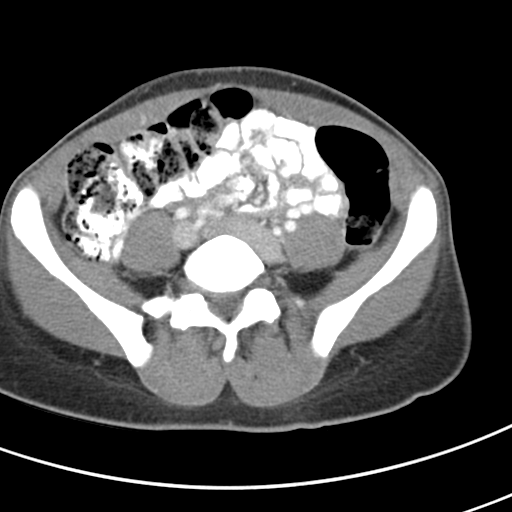
[im 39/77  soft-tissue]
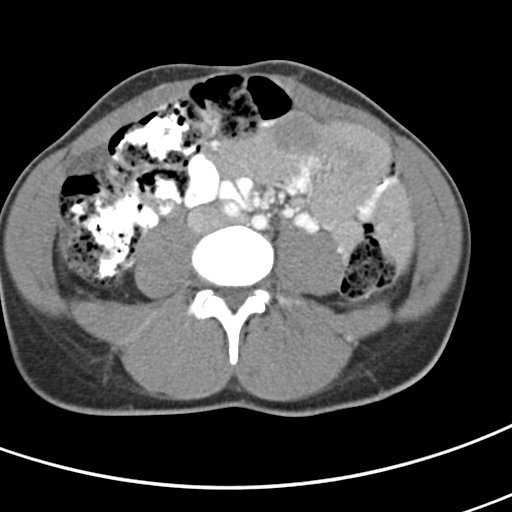
[im 44/77  soft-tissue]
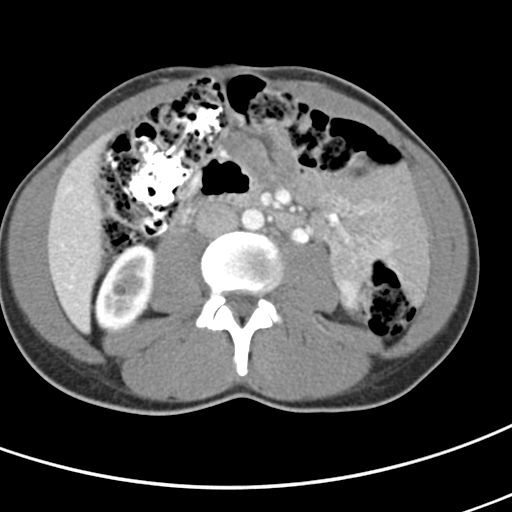
[im 50/77  soft-tissue]
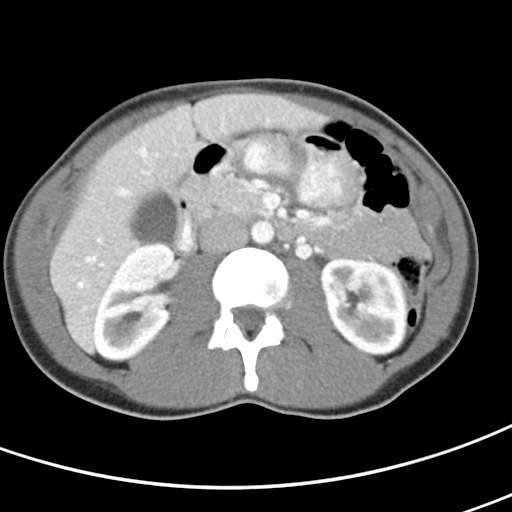
[im 50/77  bone]
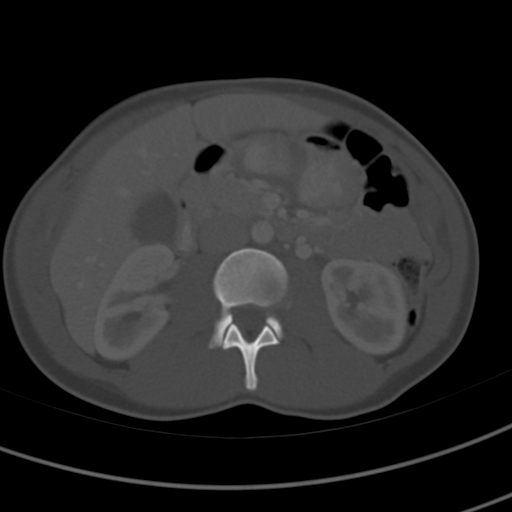
[im 56/77  soft-tissue]
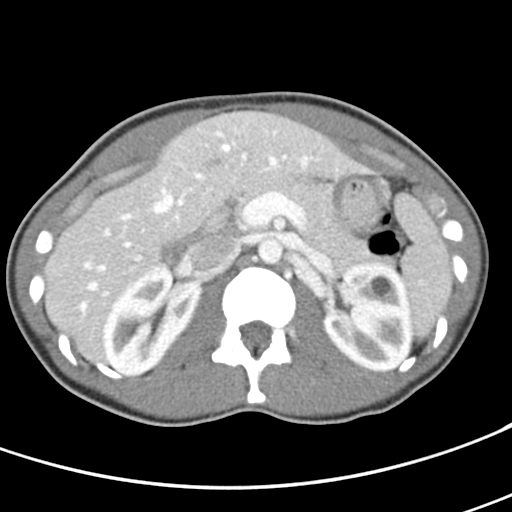
[im 62/77  soft-tissue]
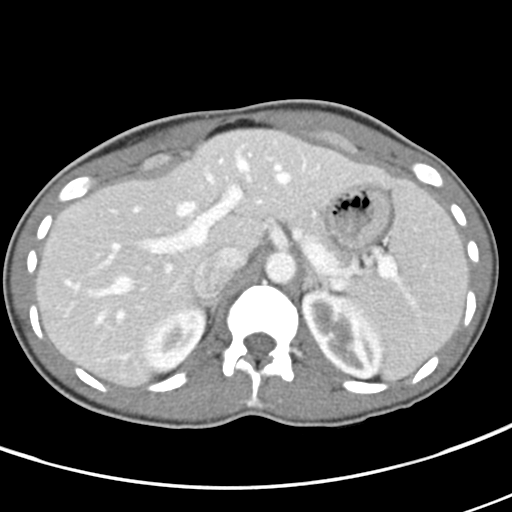
[im 65/77  lung]
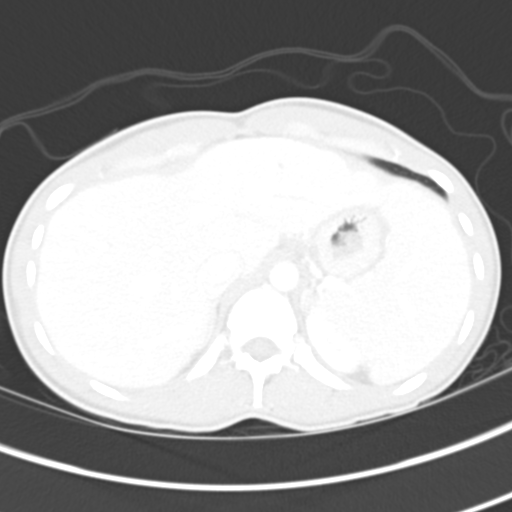
[im 68/77  soft-tissue]
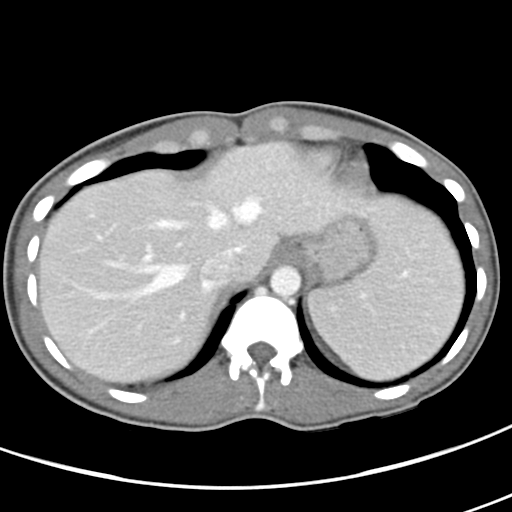
[im 68/77  lung]
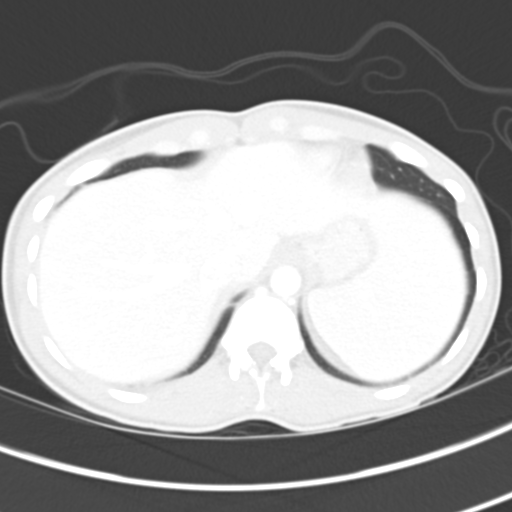
[im 71/77  lung]
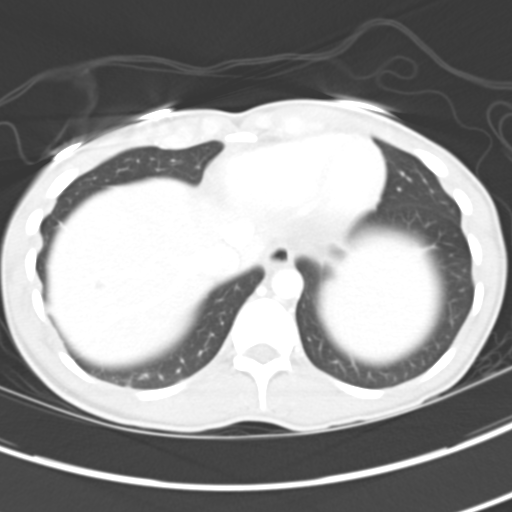
[im 74/77  soft-tissue]
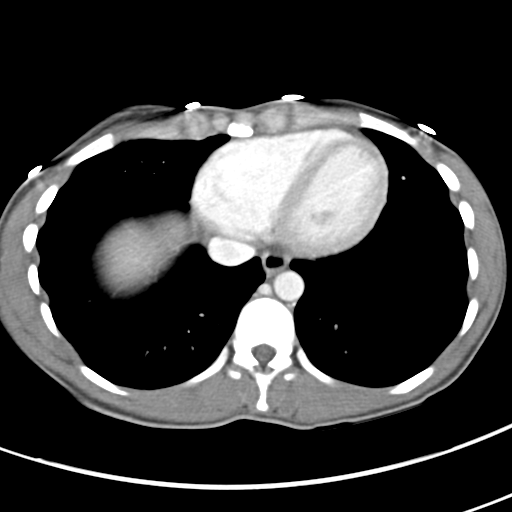
[im 74/77  lung]
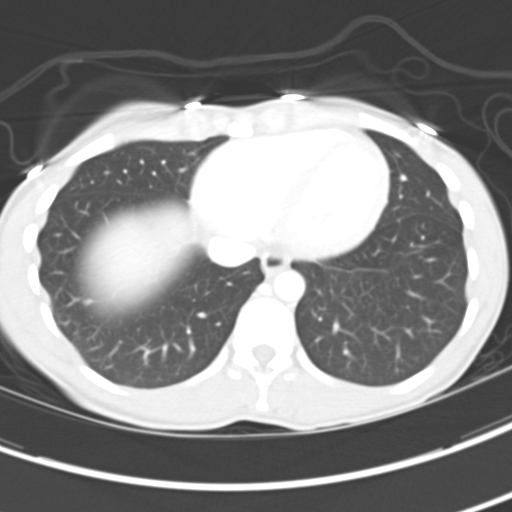

[15 of 32 positions shown; findings below may reference images not displayed]

FINDINGS: Limited images through the lung bases demonstrate no
significant appreciable abnormality. The heart size is within
normal limits. No pleural or pericardial effusion.

Subcentimeter hypodensity within the hepatic dome and left hepatic
lobe.  Otherwise, liver, biliary system, spleen pancreas, and
adrenal glands are unremarkable.  There is an upper pole cyst
within the left kidney that measures 1 cm.  There is a
subcentimeter cyst within the right kidney lower pole.  No
hydronephrosis or hydroureter.  Unable to follow the ureteral
course in their entirety as the ureters are decompressed.

No bowel obstruction.  No CT evidence for colitis.  Appendix within
normal limits.

Normal caliber vasculature.

Left adnexal cyst is likely physiologic.  CT appearance to the
uterus is nonspecific.  Thin-walled bladder.

No acute osseous abnormality identified.
IMPRESSION: No acute CT abnormality identified.

## 2012-10-18 DIAGNOSIS — O039 Complete or unspecified spontaneous abortion without complication: Secondary | ICD-10-CM

## 2012-10-18 HISTORY — DX: Complete or unspecified spontaneous abortion without complication: O03.9

## 2012-11-02 ENCOUNTER — Encounter (HOSPITAL_COMMUNITY): Payer: Self-pay | Admitting: *Deleted

## 2012-11-02 ENCOUNTER — Emergency Department (HOSPITAL_COMMUNITY)
Admission: EM | Admit: 2012-11-02 | Discharge: 2012-11-03 | Disposition: A | Discharge status: Home / Self Care | Payer: Medicaid Other | Attending: Emergency Medicine | Admitting: Emergency Medicine

## 2012-11-02 DIAGNOSIS — Z8679 Personal history of other diseases of the circulatory system: Secondary | ICD-10-CM | POA: Insufficient documentation

## 2012-11-02 DIAGNOSIS — L500 Allergic urticaria: Secondary | ICD-10-CM | POA: Insufficient documentation

## 2012-11-02 DIAGNOSIS — L509 Urticaria, unspecified: Secondary | ICD-10-CM

## 2012-11-02 DIAGNOSIS — Z8619 Personal history of other infectious and parasitic diseases: Secondary | ICD-10-CM | POA: Insufficient documentation

## 2012-11-02 DIAGNOSIS — Z862 Personal history of diseases of the blood and blood-forming organs and certain disorders involving the immune mechanism: Secondary | ICD-10-CM | POA: Insufficient documentation

## 2012-11-02 DIAGNOSIS — Z79899 Other long term (current) drug therapy: Secondary | ICD-10-CM | POA: Insufficient documentation

## 2012-11-02 DIAGNOSIS — T7840XA Allergy, unspecified, initial encounter: Secondary | ICD-10-CM

## 2012-11-02 DIAGNOSIS — Z8719 Personal history of other diseases of the digestive system: Secondary | ICD-10-CM | POA: Insufficient documentation

## 2012-11-02 DIAGNOSIS — R011 Cardiac murmur, unspecified: Secondary | ICD-10-CM | POA: Insufficient documentation

## 2012-11-02 HISTORY — DX: Complete or unspecified spontaneous abortion without complication: O03.9

## 2012-11-02 NOTE — ED Notes (Signed)
Pt states she began having itchy rash to arms, face and back last night.  Pt took Benadryl with some relief.  Pt has diffuse red flat rash that looks like hives to back, neck and face.  Pt had Nuvoring placed this past Tuesday.  She also began using new body oils Tuesday.  Pt hasn't used any of the oils since Friday.

## 2012-11-02 NOTE — ED Notes (Signed)
Allergic reaction to something . Started Friday . Scratching and itching feeling around mouth neck and arms back, rash red hives. Took generic benadryl  - went to [facility] Saturday morning and started again 4 hours ago. Patient has no difficulty breathing, just recently nuvaring Tuesday.

## 2012-11-03 MED ORDER — PREDNISONE 20 MG PO TABS
60.0000 mg | ORAL_TABLET | Freq: Once | ORAL | Status: AC
  Administered 2012-11-03: 60 mg via ORAL
  Filled 2012-11-03: qty 3

## 2012-11-03 MED ORDER — PREDNISONE 10 MG PO TABS: ORAL_TABLET | ORAL | Status: DC

## 2012-11-03 NOTE — ED Provider Notes (Signed)
History     CSN: [id number]  Arrival date & time 11/02/12  2252   First MD Initiated Contact with Patient 11/02/12 2339      Chief Complaint  Patient presents with  . Rash    around the mouth / med to lower back, neck,     (Consider location/radiation/quality/duration/timing/severity/associated sxs/prior treatment) HPI Carrie Gomez is a 27 y.o. female who presents to ED with complaint of rash. States rash started yesterday. Took benadryl with relief yesterday, but rash came back today and benadryl not helping. Rash is to the chest, back, face, abdomen, arms and legs. States started using new sented oils two days ago, however did not use any today. Also states jist started a nuvaring a week ago. Denies respiratory problems. No swelling of lips or tongue.  Past Medical History  Diagnosis Date  . Abnormal Pap smear   . Sickle cell trait   . Heart murmur   . HPV (human papilloma virus) anogenital infection   . GERD (gastroesophageal reflux disease)   . Gastritis   . Hemorrhoids   . Abortion 10/18/12    Past Surgical History  Procedure Laterality Date  . Colposcopy    . Induced abortion    . Cryotherapy    . Colonoscopy    . Upper gastrointestinal endoscopy    . Cesarean section      Family History  Problem Relation Age of Onset  . Diabetes Father   . Diabetes Paternal Grandmother     History  Substance Use Topics  . Smoking status: Never Smoker   . Smokeless tobacco: Not on file  . Alcohol Use: Yes     Comment: occ    OB History   Grav Para Term Preterm Abortions TAB SAB Ect Mult Living   7 5 3  1 1    4       Review of Systems  Constitutional: Negative for fever and chills.  HENT: Negative for neck pain and neck stiffness.   Respiratory: Negative.   Cardiovascular: Negative.   Skin: Positive for rash.    Allergies  Penicillins and Latex  Home Medications   Current Outpatient Rx  Name  Route  Sig  Dispense  Refill  . ferrous sulfate dried  (SLOW FE) 160 (50 FE) MG TBCR   Oral   Take 160 mg by mouth daily.         . metroNIDAZOLE (FLAGYL) 500 MG tablet   Oral   Take 1 tablet (500 mg total) by mouth 2 (two) times daily.   14 tablet   0   . ondansetron (ZOFRAN) 8 MG tablet   Oral   Take 1 tablet (8 mg total) by mouth every 8 (eight) hours as needed for nausea.   20 tablet   0   . promethazine (PHENERGAN) 25 MG tablet   Oral   Take 0.5-1 tablets (12.5-25 mg total) by mouth every 6 (six) hours as needed for nausea.   30 tablet   2     BP 120/78  Pulse 78  Temp(Src) 98.6 F (37 C) (Oral)  Resp 18  SpO2 100%  LMP 07/19/2012  Breastfeeding? Unknown  Physical Exam  Nursing note and vitals reviewed. Constitutional: She appears well-developed and well-nourished. No distress.  HENT:  No swelling of lipts, tongue, uvula  Cardiovascular: Normal rate, regular rhythm and normal heart sounds.   Pulmonary/Chest: Effort normal and breath sounds normal. No respiratory distress. She has no wheezes. She has no rales.  No stridor  Skin: Skin is warm and dry.  Diffuse hives all over the body including face. Does not involve oral mucosa    ED Course  Procedures (including critical care time)  Labs Reviewed - No data to display No results found.   1. Urticaria   2. Allergic reaction, initial encounter       MDM  Pt with diffuse hives. Most likely from new body oils she has been using. Instructed to stop. No relief with benadryl. Will do steroid taper for 5 days. Continue benadryl. No respiratory symptoms.   Filed Vitals:   11/02/12 2305 11/02/12 2352  BP: 118/67 120/78  Pulse: 73 78  Temp: 98.4 F (36.9 C) 98.6 F (37 C)  TempSrc: Oral Oral  Resp: 17 18  SpO2: 100% 100%          Myriam Jacobson Jalan Bodi, PA-C 11/03/12 0118

## 2012-11-05 NOTE — ED Provider Notes (Signed)
Medical screening examination/treatment/procedure(s) were performed by non-physician practitioner and as supervising physician I was immediately available for consultation/collaboration.   Analya Louissaint, MD 11/05/12 1053 

## 2014-05-25 ENCOUNTER — Encounter (HOSPITAL_COMMUNITY): Payer: Self-pay | Admitting: *Deleted

## 2017-04-10 ENCOUNTER — Encounter (HOSPITAL_COMMUNITY): Payer: Self-pay

## 2017-04-10 ENCOUNTER — Inpatient Hospital Stay (HOSPITAL_COMMUNITY)
Admission: AD | Admit: 2017-04-10 | Discharge: 2017-04-10 | Disposition: A | Discharge status: Home / Self Care | Payer: Self-pay | Source: Ambulatory Visit | Attending: Family Medicine | Admitting: Family Medicine

## 2017-04-10 DIAGNOSIS — Z880 Allergy status to penicillin: Secondary | ICD-10-CM | POA: Insufficient documentation

## 2017-04-10 DIAGNOSIS — F17210 Nicotine dependence, cigarettes, uncomplicated: Secondary | ICD-10-CM | POA: Insufficient documentation

## 2017-04-10 DIAGNOSIS — Z882 Allergy status to sulfonamides status: Secondary | ICD-10-CM | POA: Insufficient documentation

## 2017-04-10 DIAGNOSIS — K219 Gastro-esophageal reflux disease without esophagitis: Secondary | ICD-10-CM | POA: Insufficient documentation

## 2017-04-10 DIAGNOSIS — N3001 Acute cystitis with hematuria: Secondary | ICD-10-CM | POA: Insufficient documentation

## 2017-04-10 DIAGNOSIS — M545 Low back pain: Secondary | ICD-10-CM | POA: Insufficient documentation

## 2017-04-10 DIAGNOSIS — Z9889 Other specified postprocedural states: Secondary | ICD-10-CM | POA: Insufficient documentation

## 2017-04-10 DIAGNOSIS — Z91040 Latex allergy status: Secondary | ICD-10-CM | POA: Insufficient documentation

## 2017-04-10 DIAGNOSIS — R300 Dysuria: Secondary | ICD-10-CM | POA: Insufficient documentation

## 2017-04-10 DIAGNOSIS — D573 Sickle-cell trait: Secondary | ICD-10-CM | POA: Insufficient documentation

## 2017-04-10 LAB — WET PREP, GENITAL
SPERM: NONE SEEN
TRICH WET PREP: NONE SEEN
YEAST WET PREP: NONE SEEN

## 2017-04-10 LAB — URINALYSIS, ROUTINE W REFLEX MICROSCOPIC
BILIRUBIN URINE: NEGATIVE
Glucose, UA: NEGATIVE mg/dL
Ketones, ur: 5 mg/dL — AB
NITRITE: NEGATIVE
PH: 7 (ref 5.0–8.0)
Protein, ur: 100 mg/dL — AB
SPECIFIC GRAVITY, URINE: 1.009 (ref 1.005–1.030)

## 2017-04-10 LAB — POCT PREGNANCY, URINE: PREG TEST UR: NEGATIVE

## 2017-04-10 MED ORDER — CIPROFLOXACIN HCL 500 MG PO TABS: 500.0000 mg | ORAL_TABLET | Freq: Two times a day (BID) | ORAL | 0 refills | Status: AC

## 2017-04-10 MED ORDER — FLUCONAZOLE 150 MG PO TABS: 150.0000 mg | ORAL_TABLET | Freq: Once | ORAL | 0 refills | Status: AC

## 2017-04-10 MED ORDER — PHENAZOPYRIDINE HCL 200 MG PO TABS: 200.0000 mg | ORAL_TABLET | Freq: Three times a day (TID) | ORAL | 1 refills | Status: AC | PRN

## 2017-04-10 NOTE — Progress Notes (Addendum)
Presents to triage for UTI symptoms. States had them before affecting back area and voiding blood. Denies being pregnannt. UPT ordered.   Provider at bs assessing pt. Wet prep and GC done.   2147: Discharge instructions given with pt understanding. Pt left unit via ambulatory.
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
## 2017-04-10 NOTE — MAU Note (Signed)
Pt here with c/o pain with urination for about a week. Frequency as well.

## 2017-04-11 LAB — GC/CHLAMYDIA PROBE AMP (~~LOC~~) NOT AT ARMC
CHLAMYDIA, DNA PROBE: NEGATIVE
NEISSERIA GONORRHEA: NEGATIVE

## 2018-04-30 ENCOUNTER — Encounter (HOSPITAL_COMMUNITY): Payer: Self-pay

## 2018-04-30 ENCOUNTER — Inpatient Hospital Stay (HOSPITAL_COMMUNITY)
Admission: AD | Admit: 2018-04-30 | Discharge: 2018-04-30 | Discharge status: Against Medical Advice | Payer: Self-pay | Source: Ambulatory Visit | Attending: Family Medicine | Admitting: Family Medicine

## 2018-04-30 DIAGNOSIS — N898 Other specified noninflammatory disorders of vagina: Secondary | ICD-10-CM | POA: Insufficient documentation

## 2018-04-30 DIAGNOSIS — M25519 Pain in unspecified shoulder: Secondary | ICD-10-CM | POA: Insufficient documentation

## 2018-04-30 DIAGNOSIS — Z5321 Procedure and treatment not carried out due to patient leaving prior to being seen by health care provider: Secondary | ICD-10-CM | POA: Insufficient documentation

## 2018-04-30 LAB — URINALYSIS, ROUTINE W REFLEX MICROSCOPIC
BACTERIA UA: NONE SEEN
BILIRUBIN URINE: NEGATIVE
Glucose, UA: NEGATIVE mg/dL
KETONES UR: NEGATIVE mg/dL
LEUKOCYTES UA: NEGATIVE
Nitrite: NEGATIVE
PH: 6 (ref 5.0–8.0)
PROTEIN: NEGATIVE mg/dL
Specific Gravity, Urine: 1.009 (ref 1.005–1.030)

## 2018-04-30 LAB — POCT PREGNANCY, URINE: PREG TEST UR: NEGATIVE

## 2018-04-30 NOTE — MAU Note (Signed)
Not in lobby

## 2018-04-30 NOTE — MAU Note (Signed)
Pt not in lobby.  

## 2018-04-30 NOTE — MAU Note (Signed)
Shoulder pain ongoing for a month  Pain traveled up neck  States she slept on the floor last night  Vaginal discharge- no odor, yellow, no itching
